# Patient Record
Sex: Male | Born: 1995 | Race: White | Hispanic: No | Marital: Single | Smoking: Never smoker
Health system: Southern US, Community
[De-identification: ages and names within clinical notes are randomized; demographics above are authoritative.]

---
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
## 2017-06-14 ENCOUNTER — Emergency Department (HOSPITAL_COMMUNITY)
Admission: EM | Admit: 2017-06-14 | Discharge: 2017-06-14 | Disposition: A | Discharge status: Home / Self Care | Payer: BLUE CROSS/BLUE SHIELD | Attending: Emergency Medicine | Admitting: Emergency Medicine

## 2017-06-14 ENCOUNTER — Encounter (HOSPITAL_COMMUNITY): Payer: Self-pay | Admitting: Emergency Medicine

## 2017-06-14 DIAGNOSIS — F1910 Other psychoactive substance abuse, uncomplicated: Secondary | ICD-10-CM | POA: Diagnosis not present

## 2017-06-14 DIAGNOSIS — R451 Restlessness and agitation: Secondary | ICD-10-CM

## 2017-06-14 NOTE — ED Triage Notes (Addendum)
Per [facility], Pt from home. Pt took possible LSD, friend told pt's mom that he took a triple tab around 7 pm . Pt was acting erratic/aggressive and wasn't making sense. Mom wanted him brought here to be checked out. Pt received 2.5 mg of Midazolam en route. Pt alert and disoriented to place and situation.

## 2017-06-14 NOTE — ED Provider Notes (Signed)
[facility] EMERGENCY DEPARTMENT Provider Note   CSN: [id number] Arrival date & time: 06/14/17  0040     History   Chief Complaint Chief Complaint  Patient presents with  . Ingestion    LSD    HPI Larry Merritt is a 22 y.o. male.  HPI  This is a 22 year old male who presents with agitation following taking LSD.  Per report, patient took LSD at approximately 7 PM.  He has taken LSD before.  He reports also taking marijuana.  Denies alcohol, cocaine, heroin or any other illicit drugs.  Mother states that she received a phone call from his friends because he was acting erratically and appeared to be a danger to himself.  EMS was called.  Patient received 2.5 mg of midazolam in route.  He was initially disoriented for nursing but is oriented for me and able to provide history.  He denies any pain or discomfort at this time.  He is completely asymptomatic.  Mother states that he is back to his baseline.  No past medical history on file.  There are no active problems to display for this patient.   No past surgical history on file.     Home Medications    Prior to Admission medications   Not on File    Family History No family history on file.  Social History Social History   Tobacco Use  . Smoking status: Never Smoker  . Smokeless tobacco: Never Used  Substance Use Topics  . Alcohol use: No    Comment: socially  . Drug use: Yes    Frequency: 7.0 times per week    Types: Marijuana     Allergies   Patient has no known allergies.   Review of Systems Review of Systems  Respiratory: Negative for shortness of breath.   Cardiovascular: Negative for chest pain.  Gastrointestinal: Negative for abdominal pain, nausea and vomiting.  Neurological: Negative for headaches.  Psychiatric/Behavioral: Positive for agitation.  All other systems reviewed and are negative.    Physical Exam Updated Vital Signs BP (!) 151/85   Pulse (!) 109    Temp 98.8 F (37.1 C) (Oral)   Resp (!) 22   SpO2 99%   Physical Exam  Constitutional: He is oriented to person, place, and time. No distress.  Disheveled appearing but nontoxic, no acute distress  HENT:  Head: Normocephalic and atraumatic.  Eyes: Pupils are equal, round, and reactive to light.  Pupils 7 mm reactive bilaterally  Neck: Neck supple.  Cardiovascular: Normal rate, regular rhythm and normal heart sounds.  No murmur heard. Pulmonary/Chest: Effort normal and breath sounds normal. No respiratory distress. He has no wheezes.  Abdominal: Soft. Bowel sounds are normal. There is no tenderness. There is no rebound.  Musculoskeletal: He exhibits no edema.  Neurological: He is alert and oriented to person, place, and time.  Cranial nerves II through XII intact, 5 out of 5 strength in all 4 extremities, no dysmetria to finger-nose-finger  Skin: Skin is warm and dry.  Psychiatric: He has a normal mood and affect.  Nursing note and vitals reviewed.    ED Treatments / Results  Labs (all labs ordered are listed, but only abnormal results are displayed) Labs Reviewed - No data to display  EKG  EKG Interpretation None       Radiology No results found.  Procedures Procedures (including critical care time)  Medications Ordered in ED Medications - No data to display   Initial Impression /  Assessment and Plan / ED Course  I have reviewed the triage vital signs and the nursing notes.  Pertinent labs & imaging results that were available during my care of the patient were reviewed by me and considered in my medical decision making (see chart for details).     Patient presents after ingesting LSD.  He received benzodiazepine by EMS.  He is currently awake, alert, and oriented x3 for me.  He is able to provide history.  Per the patient and his mother he is back to baseline.  Vital signs at triage notable for mild tachycardia.  On my evaluation heart rate in the 90s.  No  indication for lab workup or workup at this time.  He is ambulatory and tolerating fluids.  He was observed in the emergency department for approximately 1 additional hour and discharged to the care of his mother.  After history, exam, and medical workup I feel the patient has been appropriately medically screened and is safe for discharge home. Pertinent diagnoses were discussed with the patient. Patient was given return precautions.   Final Clinical Impressions(s) / ED Diagnoses   Final diagnoses:  Polysubstance abuse [facility](HCC)  Agitation    ED Discharge Orders    None       Lathon Adan, Mayer Maskerourtney F, MD 06/14/17 [phone]

## 2017-06-14 NOTE — Discharge Instructions (Signed)
You were seen today after taking LSD.  You should be very careful about using street drugs.  You never know what they may contain.  See resources provided for outpatient drug treatment.

## 2017-06-14 NOTE — ED Notes (Signed)
Pt ambulatory to bathroom

## 2019-04-05 ENCOUNTER — Other Ambulatory Visit: Payer: Self-pay

## 2019-04-05 DIAGNOSIS — Z20822 Contact with and (suspected) exposure to covid-19: Secondary | ICD-10-CM

## 2019-04-06 LAB — NOVEL CORONAVIRUS, NAA: SARS-CoV-2, NAA: NOT DETECTED

## 2023-12-28 ENCOUNTER — Ambulatory Visit: Payer: Self-pay
